# Patient Record
Sex: Female | Born: 1995
Health system: Southern US, Community
[De-identification: ages and names within clinical notes are randomized; demographics above are authoritative.]

## PROBLEM LIST (undated history)

## (undated) HISTORY — PX: WISDOM TOOTH EXTRACTION: SHX21

---
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
## 2019-10-29 DIAGNOSIS — Z03818 Encounter for observation for suspected exposure to other biological agents ruled out: Secondary | ICD-10-CM | POA: Diagnosis not present

## 2019-11-10 DIAGNOSIS — Z20828 Contact with and (suspected) exposure to other viral communicable diseases: Secondary | ICD-10-CM | POA: Diagnosis not present

## 2019-11-18 ENCOUNTER — Emergency Department (HOSPITAL_COMMUNITY)
Admission: EM | Admit: 2019-11-18 | Discharge: 2019-11-18 | Disposition: A | Discharge status: Home / Self Care | Payer: BC Managed Care – PPO | Attending: Emergency Medicine | Admitting: Emergency Medicine

## 2019-11-18 ENCOUNTER — Emergency Department (HOSPITAL_COMMUNITY): Payer: BC Managed Care – PPO

## 2019-11-18 ENCOUNTER — Other Ambulatory Visit: Payer: Self-pay

## 2019-11-18 ENCOUNTER — Encounter (HOSPITAL_COMMUNITY): Payer: Self-pay | Admitting: Emergency Medicine

## 2019-11-18 DIAGNOSIS — R059 Cough, unspecified: Secondary | ICD-10-CM

## 2019-11-18 DIAGNOSIS — U071 COVID-19: Secondary | ICD-10-CM | POA: Diagnosis not present

## 2019-11-18 DIAGNOSIS — R05 Cough: Secondary | ICD-10-CM | POA: Diagnosis not present

## 2019-11-18 DIAGNOSIS — R0602 Shortness of breath: Secondary | ICD-10-CM | POA: Diagnosis not present

## 2019-11-18 MED ORDER — ALBUTEROL SULFATE HFA 108 (90 BASE) MCG/ACT IN AERS
2.0000 | INHALATION_SPRAY | Freq: Once | RESPIRATORY_TRACT | Status: AC
Start: 2019-11-18 — End: 2019-11-18
  Administered 2019-11-18: 16:00:00 2 via RESPIRATORY_TRACT
  Filled 2019-11-18: qty 6.7

## 2019-11-18 NOTE — ED Notes (Signed)
ED Provider at bedside. 

## 2019-11-18 NOTE — ED Provider Notes (Signed)
[facility]-EMERGENCY DEPT Provider Note   CSN: [id number] Arrival date & time: 11/18/19  1345     History Chief Complaint  Patient presents with   covid+   Cough   Shortness of Breath    April Chavez is a 23 y.o. female who presents to the ED today complaining of gradually worsening productive cough x 4 days.  She reports she began having a scratchy throat, cough, congestion on 12/8.  She was tested for COVID-19 on 12/10 and tested positive.  She states that her symptoms have not gotten any better which has her concerned.  Reports that she feels short of breath with ambulation but at rest feels okay.  She wanted to come to make sure that everything was okay.  Been taking DayQuil, NyQuil, Mucinex with mild relief.  She denies abdominal pain, nausea, vomiting, chest pain, leg swelling, palpitations, any other associated symptoms.   The history is provided by the patient.       History reviewed. No pertinent past medical history.  There are no problems to display for this patient.   Past Surgical History:  Procedure Laterality Date   WISDOM TOOTH EXTRACTION       OB History   No obstetric history on file.     No family history on file.  Social History   Tobacco Use   Smoking status: Never Smoker   Smokeless tobacco: Never Used  Substance Use Topics   Alcohol use: No   Drug use: No    Home Medications Prior to Admission medications   Medication Sig Start Date End Date Taking? Authorizing Provider  acetaminophen-codeine (TYLENOL #3) 300-30 MG per tablet Take 1-2 tablets by mouth every 4 (four) hours as needed. 08/13/14   Carmelina Dane, MD    Allergies    Patient has no known allergies.  Review of Systems   Review of Systems  Constitutional: Positive for chills and fever. Negative for fatigue.  HENT: Positive for sore throat. Negative for congestion, drooling and trouble swallowing.   Eyes: Negative for visual disturbance.    Respiratory: Positive for cough and shortness of breath.   Cardiovascular: Negative for chest pain and leg swelling.  Gastrointestinal: Negative for abdominal pain, nausea and vomiting.  Genitourinary: Negative for difficulty urinating.  Musculoskeletal: Positive for myalgias.  Skin: Negative for rash.  Neurological: Negative for headaches.    Physical Exam Updated Vital Signs BP 130/90 (BP Location: Left Arm)    Pulse 88    Temp 99.5 F (37.5 C) (Oral)    Resp 18    SpO2 100%   Physical Exam Vitals and nursing note reviewed.  Constitutional:      Appearance: She is not ill-appearing or diaphoretic.  HENT:     Head: Normocephalic and atraumatic.  Eyes:     Conjunctiva/sclera: Conjunctivae normal.  Cardiovascular:     Rate and Rhythm: Normal rate and regular rhythm.     Pulses: Normal pulses.  Pulmonary:     Effort: Pulmonary effort is normal.     Breath sounds: Normal breath sounds. No decreased breath sounds, wheezing, rhonchi or rales.  Chest:     Chest wall: No tenderness.  Abdominal:     Palpations: Abdomen is soft.     Tenderness: There is no abdominal tenderness.  Musculoskeletal:     Cervical back: Normal range of motion and neck supple.     Right lower leg: No edema.     Left lower leg: No edema.  Lymphadenopathy:     Cervical: No cervical adenopathy.  Skin:    General: Skin is warm and dry.  Neurological:     Mental Status: She is alert.     ED Results / Procedures / Treatments   Labs (all labs ordered are listed, but only abnormal results are displayed) Labs Reviewed - No data to display  EKG None  Radiology DG Chest [facility] 1 View  Result Date: 11/18/2019 CLINICAL DATA:  Cough. COVID-19 positive. Shortness of breath with exertion EXAM: PORTABLE CHEST 1 VIEW COMPARISON:  None. FINDINGS: Lungs are clear. Heart size and pulmonary vascularity are normal. No adenopathy. No bone lesions. IMPRESSION: No abnormality noted. Electronically Signed   By:  Lowella Grip III M.D.   On: 11/18/2019 14:56    Procedures Procedures (including critical care time)  Medications Ordered in ED Medications  albuterol (VENTOLIN HFA) 108 (90 Base) MCG/ACT inhaler 2 puff (has no administration in time range)    ED Course  I have reviewed the triage vital signs and the nursing notes.  Pertinent labs & imaging results that were available during my care of the patient were reviewed by me and considered in my medical decision making (see chart for details).  23 year old female who presents the ED today with Covid 19+ status.  She reports she has continued to have cough and it is gradually been worsening and shortness of breath with exertion.  Has been quarantining at home and taking over-the-counter medications with mild relief.  She appears to be in no acute distress today.  Vital signs are stable.  Her temp is mildly elevated 99.5.  She is nontachypneic and nontachycardic.  She is satting 100% on room air.  She does not appear to be working to breathe.  She is able to speak in full sentences.  I have ambulated patient myself while in the room with her.  Her oxygen saturation remained above 97% with walking.  We had a conversation easily while patient was ambulating and she did not have to stop to speak.  A chest x-ray was ordered prior to being seen given complaints.  It is clear today.  Will provide inhaler due to complaints of shortness of breath.  Patient is still in her 14-day quarantine and will be cleared on 12/25.  Is requesting a work note at this time.  Lengthy discussion with patient regarding Covid 19 symptoms.  She did have a lot of questions today and all were answered in a timely manner.  She is advised to continue self quarantine as initially.  He is stable for discharge.   This note was prepared using Dragon voice recognition software and may include unintentional dictation errors due to the inherent limitations of voice recognition  software.  April Chavez was evaluated in Emergency Department on 11/18/2019 for the symptoms described in the history of present illness. She was evaluated in the context of the global COVID-19 pandemic, which necessitated consideration that the patient might be at risk for infection with the SARS-CoV-2 virus that causes COVID-19. Institutional protocols and algorithms that pertain to the evaluation of patients at risk for COVID-19 are in a state of rapid change based on information released by regulatory bodies including the CDC and federal and state organizations. These policies and algorithms were followed during the patient's care in the ED.     MDM Rules/Calculators/A&P                       Final  Clinical Impression(s) / ED Diagnoses Final diagnoses:  Cough  COVID-19    Rx / DC Orders ED Discharge Orders    None       Tanda RockersVenter, Mouhamadou Gittleman, PA-C 11/18/19 1512    Gerhard MunchLockwood, Robert, MD 11/18/19 [phone]

## 2019-11-18 NOTE — ED Triage Notes (Signed)
Pt reports tested Covid+ last week. Reports feels SOB with exertion. Has productive cough, sore throat.

## 2019-11-18 NOTE — Discharge Instructions (Signed)
Please use inhaler as prescribed for shortness of breath. Take OTC medication as prescribed. Continue self isolating at home until 12/25.      Person Under Monitoring Name: April Chavez  Location: [address] [place] [address]   Infection Prevention Recommendations for Individuals Confirmed to have, or Being Evaluated for, 2019 Novel Coronavirus (COVID-19) Infection Who Receive Care at Home  Individuals who are confirmed to have, or are being evaluated for, COVID-19 should follow the prevention steps below until a healthcare provider or local or state health department says they can return to normal activities.  Stay home except to get medical care You should restrict activities outside your home, except for getting medical care. Do not go to work, school, or public areas, and do not use public transportation or taxis.  Call ahead before visiting your doctor Before your medical appointment, call the healthcare provider and tell them that you have, or are being evaluated for, COVID-19 infection. This will help the healthcare provider's office take steps to keep other people from getting infected. Ask your healthcare provider to call the local or state health department.  Monitor your symptoms Seek prompt medical attention if your illness is worsening (e.g., difficulty breathing). Before going to your medical appointment, call the healthcare provider and tell them that you have, or are being evaluated for, COVID-19 infection. Ask your healthcare provider to call the local or state health department.  Wear a facemask You should wear a facemask that covers your nose and mouth when you are in the same room with other people and when you visit a healthcare provider. People who live with or visit you should also wear a facemask while they are in the same room with you.  Separate yourself from other people in your home As much as possible, you should stay in a  different room from other people in your home. Also, you should use a separate bathroom, if available.  Avoid sharing household items You should not share dishes, drinking glasses, cups, eating utensils, towels, bedding, or other items with other people in your home. After using these items, you should wash them thoroughly with soap and water.  Cover your coughs and sneezes Cover your mouth and nose with a tissue when you cough or sneeze, or you can cough or sneeze into your sleeve. Throw used tissues in a lined trash can, and immediately wash your hands with soap and water for at least 20 seconds or use an alcohol-based hand rub.  Wash your [employer] your hands often and thoroughly with soap and water for at least 20 seconds. You can use an alcohol-based hand sanitizer if soap and water are not available and if your hands are not visibly dirty. Avoid touching your eyes, nose, and mouth with unwashed hands.   Prevention Steps for Caregivers and Household Members of Individuals Confirmed to have, or Being Evaluated for, COVID-19 Infection Being Cared for in the Home  If you live with, or provide care at home for, a person confirmed to have, or being evaluated for, COVID-19 infection please follow these guidelines to prevent infection:  Follow healthcare provider's instructions Make sure that you understand and can help the patient follow any healthcare provider instructions for all care.  Provide for the patient's basic needs You should help the patient with basic needs in the home and provide support for getting groceries, prescriptions, and other personal needs.  Monitor the patient's symptoms If they are getting sicker, call his  or her medical provider and tell them that the patient has, or is being evaluated for, COVID-19 infection. This will help the healthcare provider's office take steps to keep other people from getting infected. Ask the healthcare provider to call the local  or state health department.  Limit the number of people who have contact with the patient If possible, have only one caregiver for the patient. Other household members should stay in another home or place of residence. If this is not possible, they should stay in another room, or be separated from the patient as much as possible. Use a separate bathroom, if available. Restrict visitors who do not have an essential need to be in the home.  Keep older adults, very young children, and other sick people away from the patient Keep older adults, very young children, and those who have compromised immune systems or chronic health conditions away from the patient. This includes people with chronic heart, lung, or kidney conditions, diabetes, and cancer.  Ensure good ventilation Make sure that shared spaces in the home have good air flow, such as from an air conditioner or an opened window, weather permitting.  Wash your hands often Wash your hands often and thoroughly with soap and water for at least 20 seconds. You can use an alcohol based hand sanitizer if soap and water are not available and if your hands are not visibly dirty. Avoid touching your eyes, nose, and mouth with unwashed hands. Use disposable paper towels to dry your hands. If not available, use dedicated cloth towels and replace them when they become wet.  Wear a facemask and gloves Wear a disposable facemask at all times in the room and gloves when you touch or have contact with the patient's blood, body fluids, and/or secretions or excretions, such as sweat, saliva, sputum, nasal mucus, vomit, urine, or feces.  Ensure the mask fits over your nose and mouth tightly, and do not touch it during use. Throw out disposable facemasks and gloves after using them. Do not reuse. Wash your hands immediately after removing your facemask and gloves. If your personal clothing becomes contaminated, carefully remove clothing and launder. Wash your  hands after handling contaminated clothing. Place all used disposable facemasks, gloves, and other waste in a lined container before disposing them with other household waste. Remove gloves and wash your hands immediately after handling these items.  Do not share dishes, glasses, or other household items with the patient Avoid sharing household items. You should not share dishes, drinking glasses, cups, eating utensils, towels, bedding, or other items with a patient who is confirmed to have, or being evaluated for, COVID-19 infection. After the person uses these items, you should wash them thoroughly with soap and water.  Wash laundry thoroughly Immediately remove and wash clothes or bedding that have blood, body fluids, and/or secretions or excretions, such as sweat, saliva, sputum, nasal mucus, vomit, urine, or feces, on them. Wear gloves when handling laundry from the patient. Read and follow directions on labels of laundry or clothing items and detergent. In general, wash and dry with the warmest temperatures recommended on the label.  Clean all areas the individual has used often Clean all touchable surfaces, such as counters, tabletops, doorknobs, bathroom fixtures, toilets, phones, keyboards, tablets, and bedside tables, every day. Also, clean any surfaces that may have blood, body fluids, and/or secretions or excretions on them. Wear gloves when cleaning surfaces the patient has come in contact with. Use a diluted bleach solution (e.g., dilute  bleach with 1 part bleach and 10 parts water) or a household disinfectant with a label that says EPA-registered for coronaviruses. To make a bleach solution at home, add 1 tablespoon of bleach to 1 quart (4 cups) of water. For a larger supply, add  cup of bleach to 1 gallon (16 cups) of water. Read labels of cleaning products and follow recommendations provided on product labels. Labels contain instructions for safe and effective use of the cleaning  product including precautions you should take when applying the product, such as wearing gloves or eye protection and making sure you have good ventilation during use of the product. Remove gloves and wash hands immediately after cleaning.  Monitor yourself for signs and symptoms of illness Caregivers and household members are considered close contacts, should monitor their health, and will be asked to limit movement outside of the home to the extent possible. Follow the monitoring steps for close contacts listed on the symptom monitoring form.   ? If you have additional questions, contact your local health department or call the epidemiologist on call at [phone] (available 24/7). ? This guidance is subject to change. For the most up-to-date guidance from [facility], please refer to their website: TripMetro.huhttps://www.cdc.gov/coronavirus/2019-ncov/hcp/guidance-prevent-spread.html

## 2019-11-18 NOTE — ED Notes (Signed)
XR at bedside

## 2020-02-24 DIAGNOSIS — J029 Acute pharyngitis, unspecified: Secondary | ICD-10-CM | POA: Diagnosis not present

## 2020-05-14 DIAGNOSIS — Z130 Encounter for screening for diseases of the blood and blood-forming organs and certain disorders involving the immune mechanism: Secondary | ICD-10-CM | POA: Diagnosis not present

## 2020-05-14 DIAGNOSIS — Z6835 Body mass index (BMI) 35.0-35.9, adult: Secondary | ICD-10-CM | POA: Diagnosis not present

## 2020-05-14 DIAGNOSIS — Z113 Encounter for screening for infections with a predominantly sexual mode of transmission: Secondary | ICD-10-CM | POA: Diagnosis not present

## 2020-05-14 DIAGNOSIS — Z01419 Encounter for gynecological examination (general) (routine) without abnormal findings: Secondary | ICD-10-CM | POA: Diagnosis not present

## 2020-05-14 DIAGNOSIS — Z1389 Encounter for screening for other disorder: Secondary | ICD-10-CM | POA: Diagnosis not present

## 2020-07-19 DIAGNOSIS — F3289 Other specified depressive episodes: Secondary | ICD-10-CM | POA: Diagnosis not present

## 2020-10-19 DIAGNOSIS — Z20822 Contact with and (suspected) exposure to covid-19: Secondary | ICD-10-CM | POA: Diagnosis not present

## 2020-10-19 DIAGNOSIS — J069 Acute upper respiratory infection, unspecified: Secondary | ICD-10-CM | POA: Diagnosis not present

## 2020-10-22 DIAGNOSIS — J069 Acute upper respiratory infection, unspecified: Secondary | ICD-10-CM | POA: Diagnosis not present

## 2020-10-22 DIAGNOSIS — Z20828 Contact with and (suspected) exposure to other viral communicable diseases: Secondary | ICD-10-CM | POA: Diagnosis not present

## 2021-02-14 DIAGNOSIS — R102 Pelvic and perineal pain: Secondary | ICD-10-CM | POA: Diagnosis not present

## 2021-05-04 DIAGNOSIS — R0981 Nasal congestion: Secondary | ICD-10-CM | POA: Diagnosis not present

## 2021-05-04 DIAGNOSIS — J3489 Other specified disorders of nose and nasal sinuses: Secondary | ICD-10-CM | POA: Diagnosis not present

## 2021-05-04 DIAGNOSIS — R059 Cough, unspecified: Secondary | ICD-10-CM | POA: Diagnosis not present

## 2021-05-04 DIAGNOSIS — J029 Acute pharyngitis, unspecified: Secondary | ICD-10-CM | POA: Diagnosis not present

## 2021-05-26 IMAGING — DX DG CHEST 1V PORT
1 series · 1 of 1 positions shown · non-contrast
Comparison: None.

CLINICAL DATA: Cough. AHZP1-1S positive. Shortness of breath with
exertion

EXAM:
PORTABLE CHEST 1 VIEW

[chest ap]
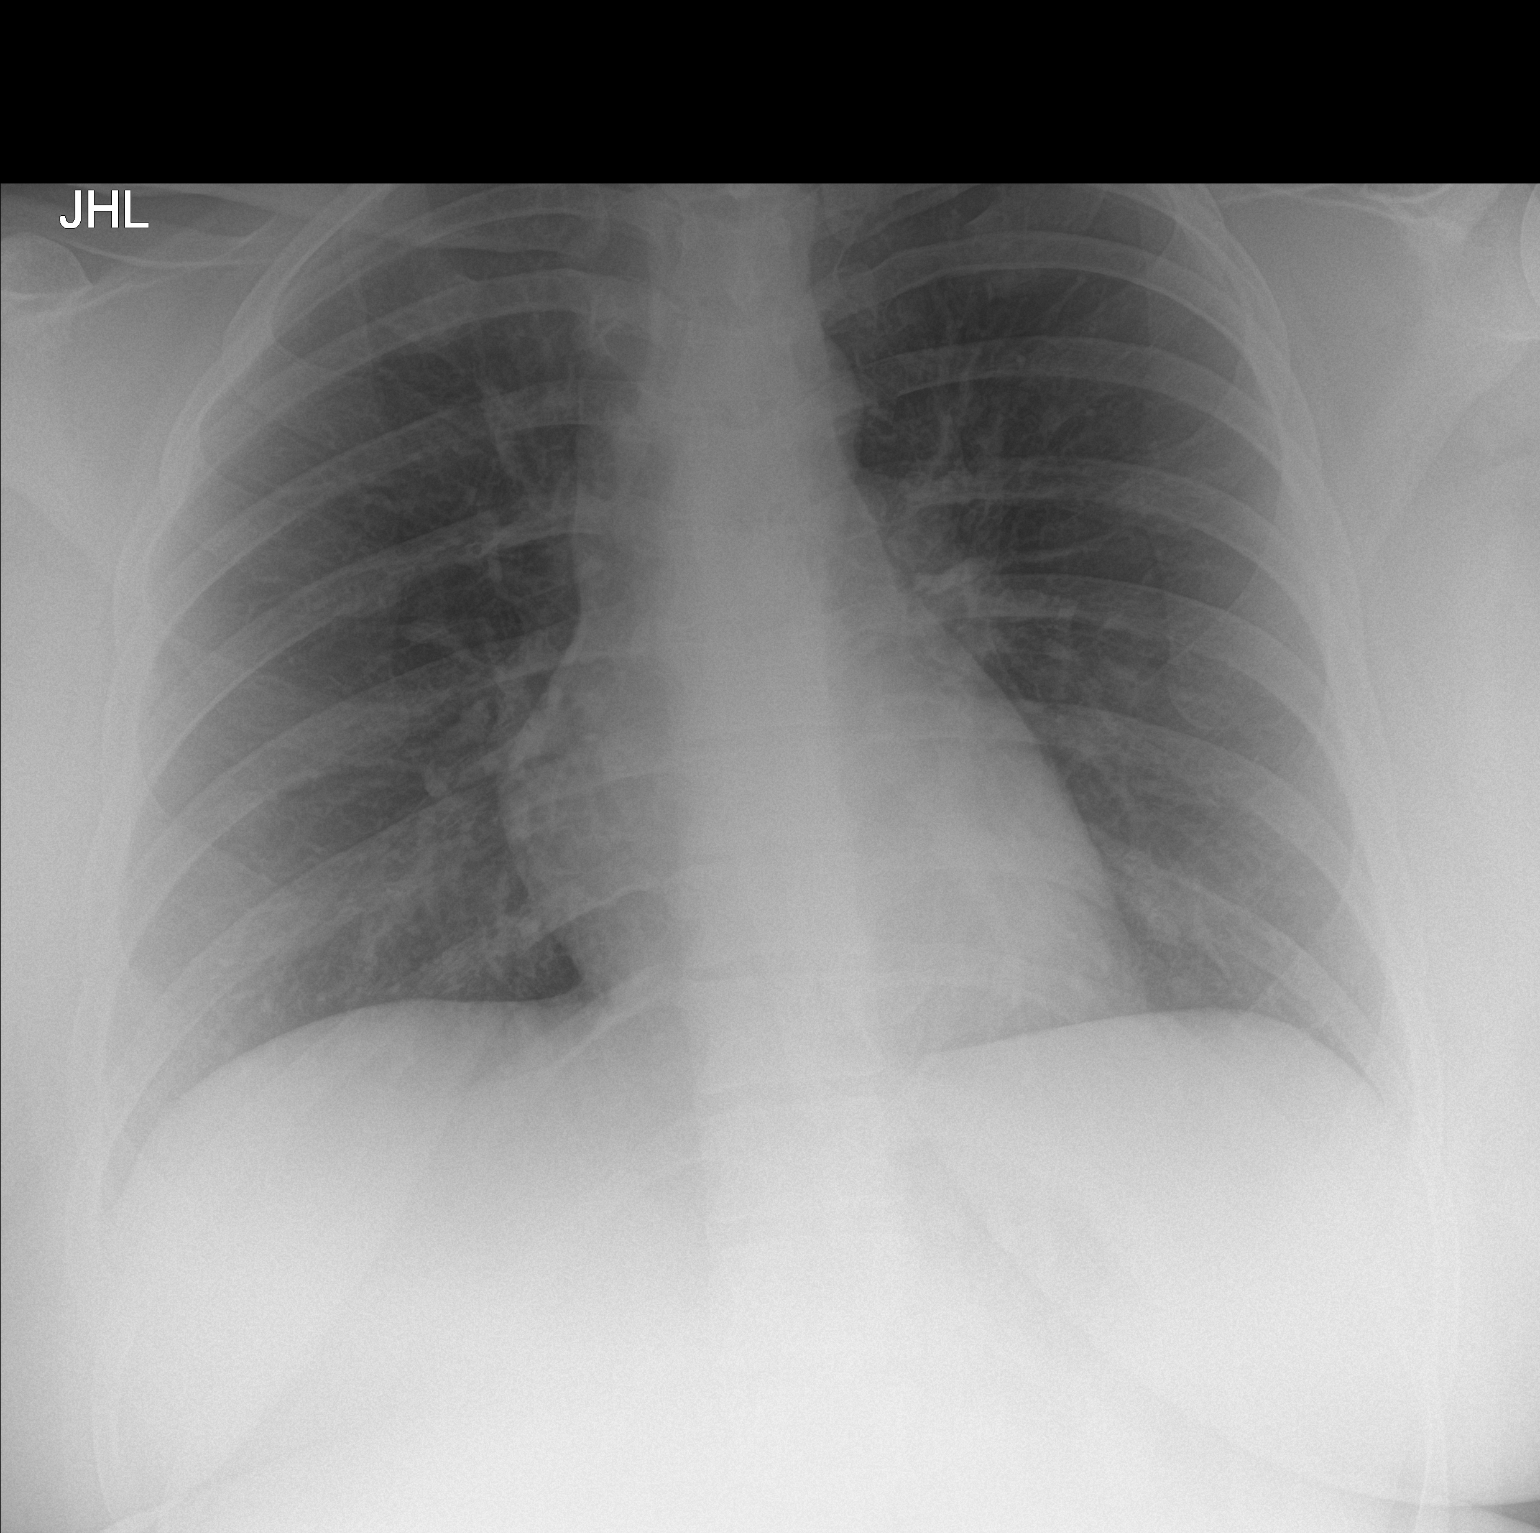

[1 of 1 positions shown; findings below may reference images not displayed]

FINDINGS: Lungs are clear. Heart size and pulmonary vascularity are normal. No
adenopathy. No bone lesions.
IMPRESSION: No abnormality noted.

## 2021-06-19 DIAGNOSIS — R634 Abnormal weight loss: Secondary | ICD-10-CM | POA: Diagnosis not present

## 2021-06-19 DIAGNOSIS — E559 Vitamin D deficiency, unspecified: Secondary | ICD-10-CM | POA: Diagnosis not present

## 2021-06-19 DIAGNOSIS — J019 Acute sinusitis, unspecified: Secondary | ICD-10-CM | POA: Diagnosis not present

## 2021-06-19 DIAGNOSIS — Z0000 Encounter for general adult medical examination without abnormal findings: Secondary | ICD-10-CM | POA: Diagnosis not present

## 2021-06-19 DIAGNOSIS — Z13220 Encounter for screening for lipoid disorders: Secondary | ICD-10-CM | POA: Diagnosis not present

## 2021-08-28 DIAGNOSIS — Z01419 Encounter for gynecological examination (general) (routine) without abnormal findings: Secondary | ICD-10-CM | POA: Diagnosis not present

## 2021-08-28 DIAGNOSIS — Z1151 Encounter for screening for human papillomavirus (HPV): Secondary | ICD-10-CM | POA: Diagnosis not present

## 2021-08-28 DIAGNOSIS — Z130 Encounter for screening for diseases of the blood and blood-forming organs and certain disorders involving the immune mechanism: Secondary | ICD-10-CM | POA: Diagnosis not present

## 2021-08-28 DIAGNOSIS — Z124 Encounter for screening for malignant neoplasm of cervix: Secondary | ICD-10-CM | POA: Diagnosis not present

## 2021-10-02 DIAGNOSIS — R059 Cough, unspecified: Secondary | ICD-10-CM | POA: Diagnosis not present

## 2021-10-02 DIAGNOSIS — Z3046 Encounter for surveillance of implantable subdermal contraceptive: Secondary | ICD-10-CM | POA: Diagnosis not present

## 2021-10-02 DIAGNOSIS — J309 Allergic rhinitis, unspecified: Secondary | ICD-10-CM | POA: Diagnosis not present

## 2021-10-02 DIAGNOSIS — H1045 Other chronic allergic conjunctivitis: Secondary | ICD-10-CM | POA: Diagnosis not present

## 2021-12-02 DIAGNOSIS — Z20822 Contact with and (suspected) exposure to covid-19: Secondary | ICD-10-CM | POA: Diagnosis not present

## 2022-02-04 DIAGNOSIS — B360 Pityriasis versicolor: Secondary | ICD-10-CM | POA: Diagnosis not present
